# Patient Record
Sex: Male | Born: 1975 | Race: White | Marital: Married
Health system: Southern US, Community
[De-identification: ages and names within clinical notes are randomized; demographics above are authoritative.]

---

## 1999-07-29 DIAGNOSIS — S335XXA Sprain of ligaments of lumbar spine, initial encounter: Secondary | ICD-10-CM | POA: Insufficient documentation

## 1999-07-29 DIAGNOSIS — M545 Low back pain, unspecified: Secondary | ICD-10-CM | POA: Insufficient documentation

## 1999-08-29 DIAGNOSIS — J309 Allergic rhinitis, unspecified: Secondary | ICD-10-CM | POA: Insufficient documentation

## 2000-10-11 DIAGNOSIS — L600 Ingrowing nail: Secondary | ICD-10-CM | POA: Insufficient documentation

## 2001-05-27 DIAGNOSIS — M25519 Pain in unspecified shoulder: Secondary | ICD-10-CM | POA: Insufficient documentation

## 2010-01-20 DIAGNOSIS — F329 Major depressive disorder, single episode, unspecified: Secondary | ICD-10-CM | POA: Insufficient documentation

## 2010-01-20 DIAGNOSIS — Z9889 Other specified postprocedural states: Secondary | ICD-10-CM | POA: Insufficient documentation

## 2010-01-20 DIAGNOSIS — F32A Depression, unspecified: Secondary | ICD-10-CM | POA: Insufficient documentation

## 2014-07-23 DIAGNOSIS — N4601 Organic azoospermia: Secondary | ICD-10-CM | POA: Insufficient documentation

## 2014-07-23 DIAGNOSIS — R6882 Decreased libido: Secondary | ICD-10-CM | POA: Insufficient documentation

## 2014-07-23 DIAGNOSIS — N529 Male erectile dysfunction, unspecified: Secondary | ICD-10-CM | POA: Insufficient documentation

## 2014-07-23 DIAGNOSIS — E291 Testicular hypofunction: Secondary | ICD-10-CM | POA: Insufficient documentation

## 2015-02-24 DIAGNOSIS — M5416 Radiculopathy, lumbar region: Secondary | ICD-10-CM | POA: Insufficient documentation

## 2015-04-09 ENCOUNTER — Other Ambulatory Visit: Payer: Self-pay | Admitting: Neurosurgery

## 2015-04-09 DIAGNOSIS — M5126 Other intervertebral disc displacement, lumbar region: Secondary | ICD-10-CM

## 2015-04-23 ENCOUNTER — Other Ambulatory Visit: Payer: Self-pay

## 2015-04-26 ENCOUNTER — Inpatient Hospital Stay
Admission: RE | Admit: 2015-04-26 | Discharge: 2015-04-26 | Disposition: A | Discharge status: Home / Self Care | Payer: Self-pay | Source: Ambulatory Visit | Attending: Neurosurgery | Admitting: Neurosurgery

## 2015-04-26 ENCOUNTER — Other Ambulatory Visit: Payer: Self-pay

## 2015-04-26 NOTE — Discharge Instructions (Signed)
Myelogram Discharge Instructions  1. Go home and rest quietly for the next 24 hours.  It is important to lie flat for the next 24 hours.  Get up only to go to the restroom.  You may lie in the bed or on a couch on your back, your stomach, your left side or your right side.  You may have one pillow under your head.  You may have pillows between your knees while you are on your side or under your knees while you are on your back.  2. DO NOT drive today.  Recline the seat as far back as it will go, while still wearing your seat belt, on the way home.  3. You may get up to go to the bathroom as needed.  You may sit up for 10 minutes to eat.  You may resume your normal diet and medications unless otherwise indicated.  Drink lots of extra fluids today and tomorrow.  4. The incidence of headache, nausea, or vomiting is about 5% (one in 20 patients).  If you develop a headache, lie flat and drink plenty of fluids until the headache goes away.  Caffeinated beverages may be helpful.  If you develop severe nausea and vomiting or a headache that does not go away with flat bed rest, call [phone].  5. You may resume normal activities after your 24 hours of bed rest is over; however, do not exert yourself strongly or do any heavy lifting tomorrow. If when you get up you have a headache when standing, go back to bed and force fluids for another 24 hours.  6. Call your physician for a follow-up appointment.  The results of your myelogram will be sent directly to your physician by the following day.  7. If you have any questions or if complications develop after you arrive home, please call [phone].  Discharge instructions have been explained to the patient.  The patient, or the person responsible for the patient, fully understands these instructions.       May resume S[facility] on Dec. 10, 2017, after 1:00 pm.

## 2015-05-05 ENCOUNTER — Ambulatory Visit
Admission: RE | Admit: 2015-05-05 | Discharge: 2015-05-05 | Disposition: A | Discharge status: Home / Self Care | Payer: Medicare Other | Source: Ambulatory Visit | Attending: Neurosurgery | Admitting: Neurosurgery

## 2015-05-05 DIAGNOSIS — M5126 Other intervertebral disc displacement, lumbar region: Secondary | ICD-10-CM

## 2015-05-05 DIAGNOSIS — K279 Peptic ulcer, site unspecified, unspecified as acute or chronic, without hemorrhage or perforation: Secondary | ICD-10-CM | POA: Insufficient documentation

## 2015-05-05 MED ORDER — IOHEXOL 180 MG/ML  SOLN
15.0000 mL | Freq: Once | INTRAMUSCULAR | Status: AC | PRN
  Administered 2015-05-05: 15 mL via INTRATHECAL

## 2015-05-05 MED ORDER — DIAZEPAM 5 MG PO TABS
10.0000 mg | ORAL_TABLET | Freq: Once | ORAL | Status: AC
  Administered 2015-05-05: 5 mg via ORAL

## 2015-05-05 NOTE — Progress Notes (Signed)
Patient states he has been off S[facility] for at least the past two days.

## 2015-05-05 NOTE — Discharge Instructions (Signed)
Myelogram Discharge Instructions  1. Go home and rest quietly for the next 24 hours.  It is important to lie flat for the next 24 hours.  Get up only to go to the restroom.  You may lie in the bed or on a couch on your back, your stomach, your left side or your right side.  You may have one pillow under your head.  You may have pillows between your knees while you are on your side or under your knees while you are on your back.  2. DO NOT drive today.  Recline the seat as far back as it will go, while still wearing your seat belt, on the way home.  3. You may get up to go to the bathroom as needed.  You may sit up for 10 minutes to eat.  You may resume your normal diet and medications unless otherwise indicated.  Drink lots of extra fluids today and tomorrow.  4. The incidence of headache, nausea, or vomiting is about 5% (one in 20 patients).  If you develop a headache, lie flat and drink plenty of fluids until the headache goes away.  Caffeinated beverages may be helpful.  If you develop severe nausea and vomiting or a headache that does not go away with flat bed rest, call [phone].  5. You may resume normal activities after your 24 hours of bed rest is over; however, do not exert yourself strongly or do any heavy lifting tomorrow. If when you get up you have a headache when standing, go back to bed and force fluids for another 24 hours.  6. Call your physician for a follow-up appointment.  The results of your myelogram will be sent directly to your physician by the following day.  7. If you have any questions or if complications develop after you arrive home, please call [phone].  Discharge instructions have been explained to the patient.  The patient, or the person responsible for the patient, fully understands these instructions.       May resume [facility] on Jan. 19, 2017, after 1:00 pm.

## 2016-08-13 IMAGING — CT CT L SPINE W/ CM
3 of 8 series · 9 of 33 positions shown, 11 images · non-contrast
Comparison: Outside lumbar spine CT 03/12/2015 and MRI 12/04/2014,
both from [facility]]

CLINICAL DATA: Displacement of lumbar intervertebral disc.
Radicular pain in the posterior left lower extremity. Urinary
incontinence. Prior lumbar surgery.
TECHNIQUE: Contiguous axial images were obtained through the Lumbar spine after
the intrathecal infusion of contrast. Coronal and sagittal
reconstructions were obtained of the axial image sets.

[Series 2: l spine soft · axial · 0.28mm/px · z∈[-74,-74]mm · 1 of 83 slices shown, 2 images]
[im 47/83  soft-tissue]
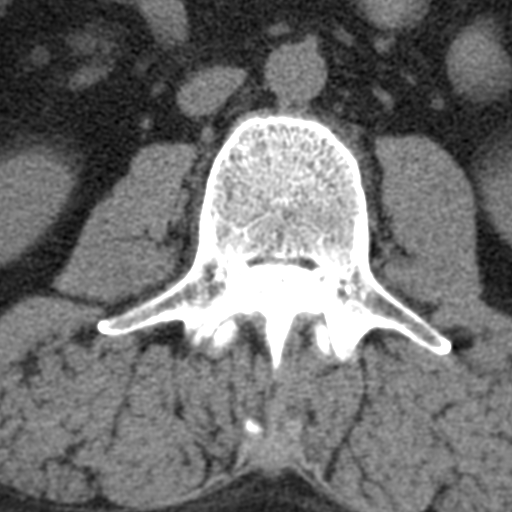
[im 47/83  bone]
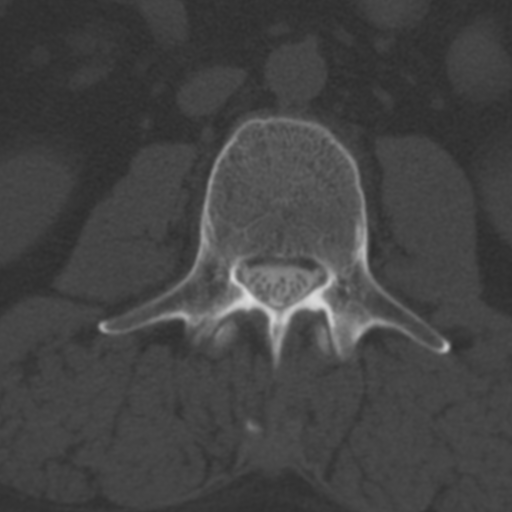

[Series 6: bone cor · coronal · 0.27mm/px · 3 of 45 slices shown]
[im 9/45  bone]
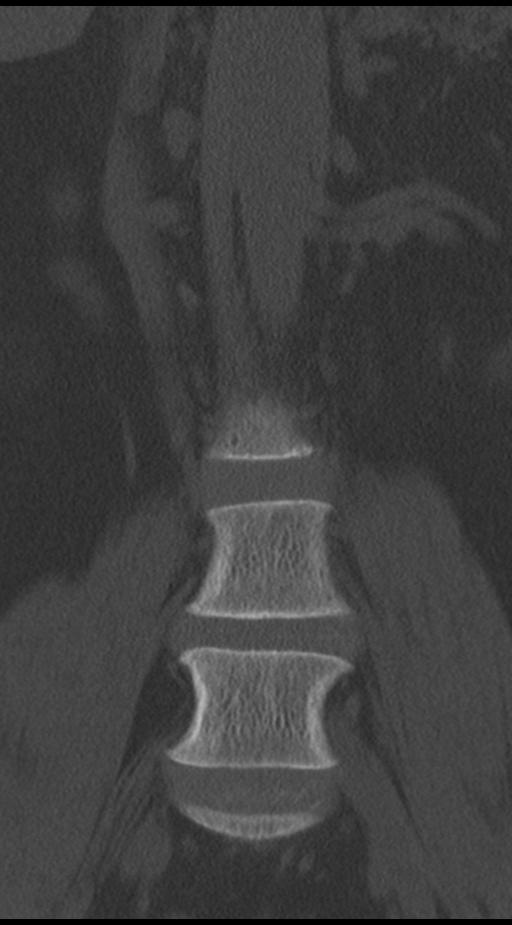
[im 18/45  bone]
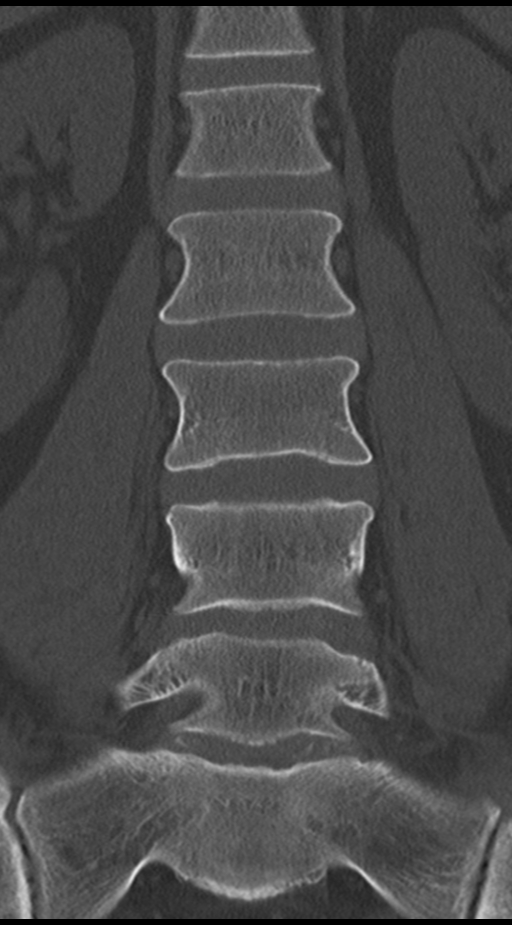
[im 27/45  bone]
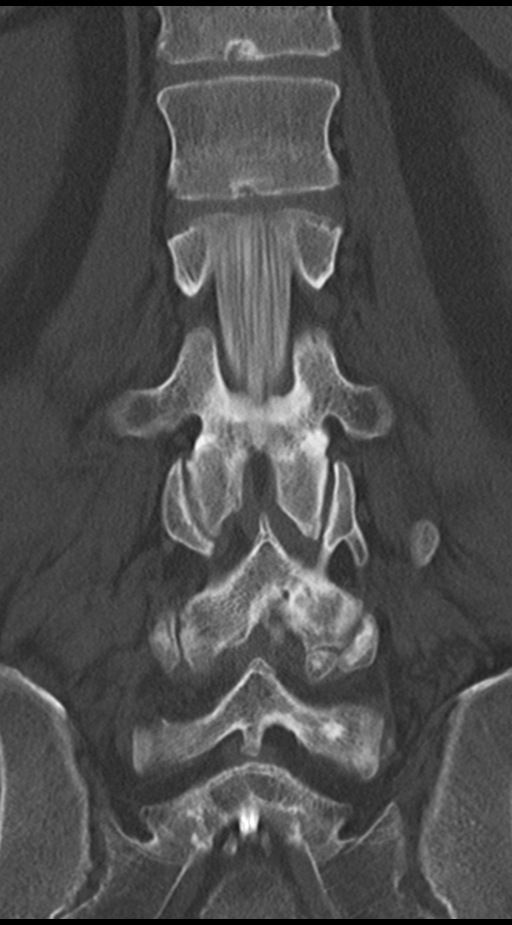

[Series 7: sag bone · sagittal · 0.28mm/px · 5 of 42 slices shown, 6 images]
[im 14/42  bone]
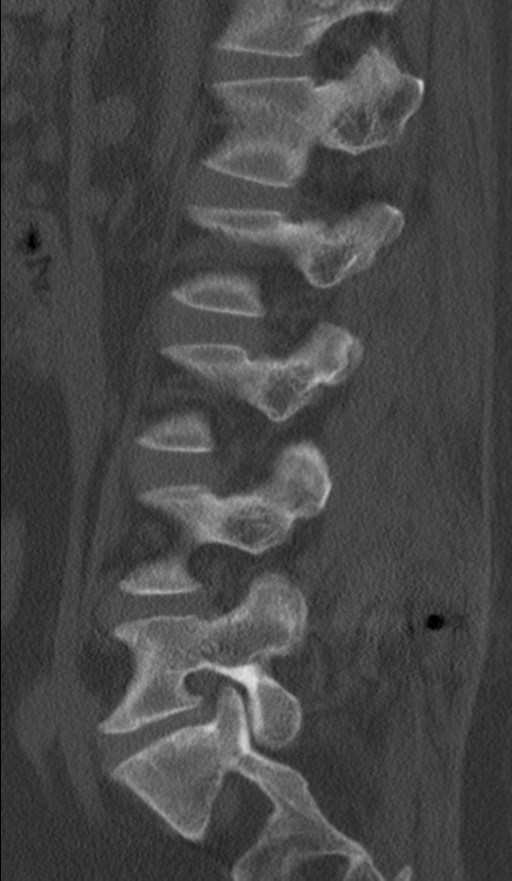
[im 18/42  bone]
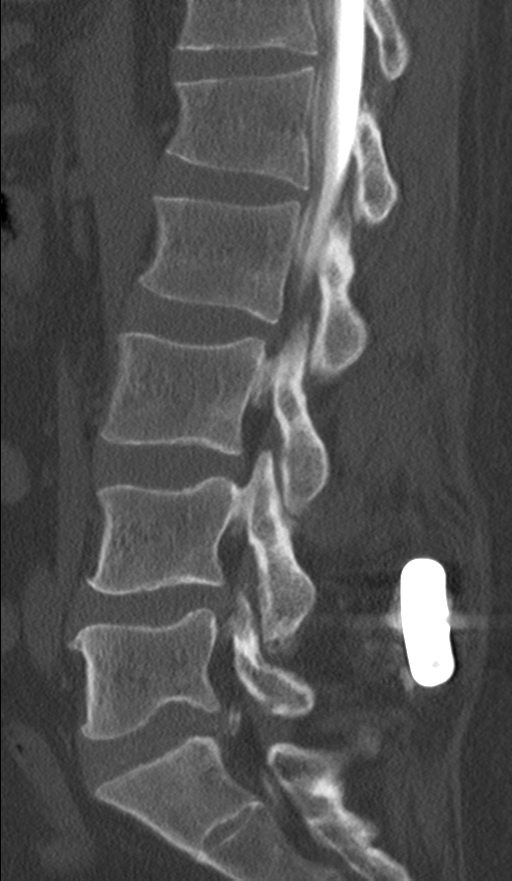
[im 21/42  soft-tissue]
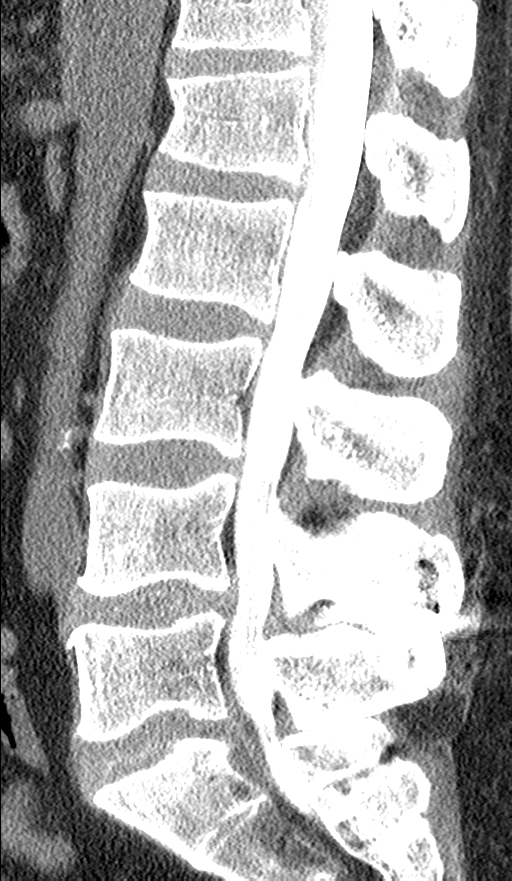
[im 21/42  bone]
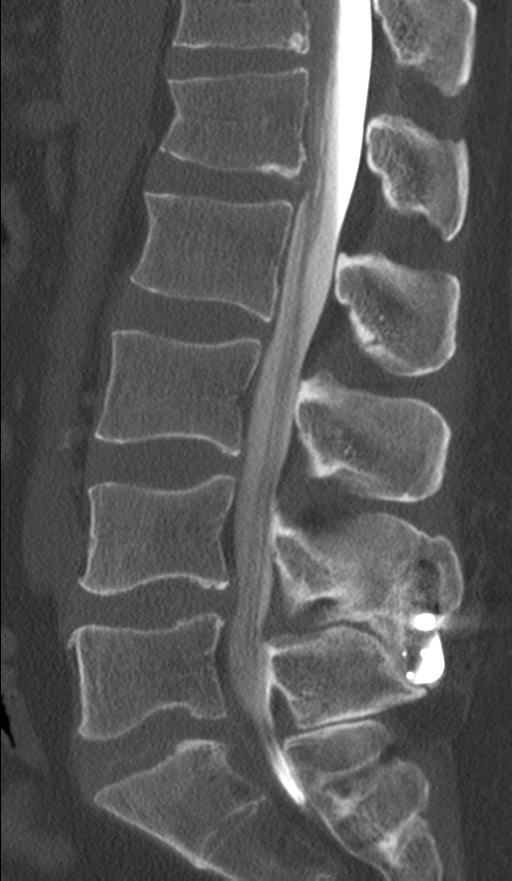
[im 24/42  bone]
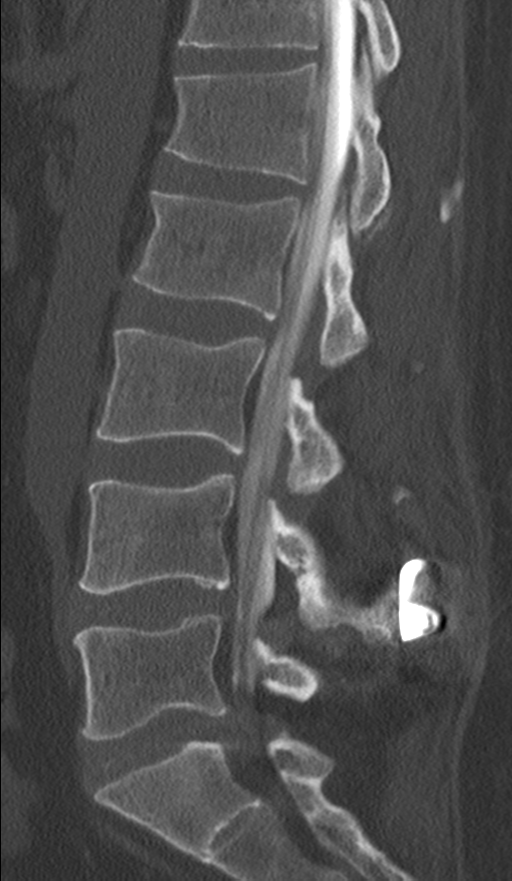
[im 28/42  bone]
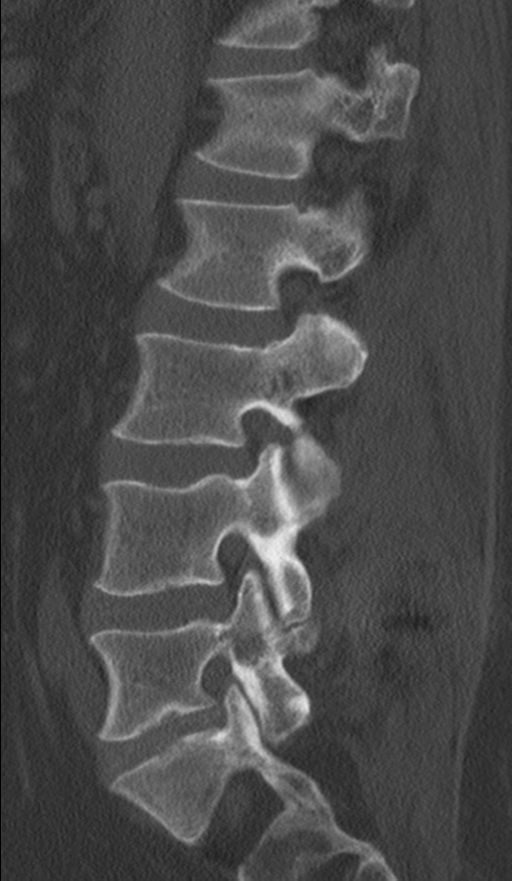

[9 of 33 positions shown; findings below may reference images not displayed]

EXAM:
LUMBAR MYELOGRAM

FLUOROSCOPY TIME:  Fluoroscopy Time (in minutes and seconds): 0
minutes 56 seconds

Number of Acquired Images:  13

PROCEDURE:
After thorough discussion of risks and benefits of the procedure
including bleeding, infection, injury to nerves, blood vessels,
adjacent structures as well as headache and CSF leak, written and
oral informed consent was obtained. Consent was obtained by Dr.
Polin Billiot. Time out form was completed.

Patient was positioned prone on the fluoroscopy table. Local
anesthesia was provided with 1% lidocaine without epinephrine after
prepped and draped in the usual sterile fashion. Puncture was
performed at L3-4 using a 3 1/2 inch 22-gauge spinal needle via a
left interlaminar approach. Using a single pass through the dura,
the needle was placed within the thecal sac, with return of clear
CSF. 15 mL of Isovue-M 200 was injected into the thecal sac, with
normal opacification of the nerve roots and cauda equina consistent
with free flow within the subarachnoid space.

I personally performed the lumbar puncture and administered the
intrathecal contrast. I also personally supervised acquisition of
the myelogram images.
FINDINGS: LUMBAR MYELOGRAM FINDINGS:

An X-STOP device is again seen at L4-5. There is slight
retrolisthesis of L4 on L5 and L5 on S1 which does not significantly
change with flexion or extension. Mild chronic anterior wedging of
the L1 vertebral body is unchanged. A shallow ventral extradural
defect is present at L4-5 without evidence of significant spinal
stenosis at this level. At L5-S1, there is a prominent extradural
impression on the left aspect of the thecal sac with cut off of the
left S1 nerve root sleeve as well as likely left S2 nerve root
impingement.

CT LUMBAR MYELOGRAM FINDINGS:

Slight retrolisthesis of L5 on S1 measures 3 mm. There is 2 mm
retrolisthesis of L4 on L5. There is mild, chronic anterior wedging
of the L1 vertebral body. An X-STOP interspinous device is present
at L4-5. The conus medullaris terminates at L1. Minimal
atherosclerotic vascular calcification is noted.

T12-L1:  Negative.

L1-2:  Minimal disc bulging and endplate spurring without stenosis.

L2-3:  Mild facet hypertrophy without disc herniation or stenosis.

L3-4:  Mild facet hypertrophy without disc herniation or stenosis.

L4-5: Prior left laminotomy. Shallow central disc protrusion, mild
disc bulging, and mild facet hypertrophy result in mild left greater
than right lateral recess stenosis without significant spinal canal
or neural foraminal stenosis.

L5-S1: Prior left laminotomy. Large left paracentral/ subarticular
disc extrusion with caudal migration to the mid S1 vertebral body
level results in marked left lateral recess stenosis and moderate
spinal stenosis. There is mass effect on multiple left-sided nerve
roots, most notably S1 and S2. This is similar in appearance to the
prior MRI. Mild bilateral neural foraminal stenosis due to disc
bulging and mild facet hypertrophy is unchanged.
IMPRESSION: 1. Large left paracentral/subarticular disc extrusion at L5-S1 with
mass effect on multiple left-sided nerve roots.
2. Mild bilateral neural foraminal stenosis at L5-S1.
3. Postoperative changes at L4-5 with mild residual left greater
than right lateral recess stenosis.

## 2019-11-16 DEATH — deceased
